# Patient Record
Sex: Female | Born: 1965 | Race: White | Hispanic: No | Marital: Married | Smoking: Never smoker
Health system: Southern US, Community
[De-identification: ages and names within clinical notes are randomized; demographics above are authoritative.]

## PROBLEM LIST (undated history)

## (undated) DIAGNOSIS — T7840XA Allergy, unspecified, initial encounter: Secondary | ICD-10-CM

## (undated) HISTORY — DX: Allergy, unspecified, initial encounter: T78.40XA

---

## 1999-09-12 ENCOUNTER — Inpatient Hospital Stay (HOSPITAL_COMMUNITY): Admission: AD | Admit: 1999-09-12 | Discharge: 1999-09-15 | Payer: Self-pay | Admitting: Obstetrics & Gynecology

## 1999-09-15 ENCOUNTER — Encounter (HOSPITAL_COMMUNITY): Admission: RE | Admit: 1999-09-15 | Discharge: 1999-12-14 | Payer: Self-pay | Admitting: Obstetrics & Gynecology

## 1999-10-13 ENCOUNTER — Other Ambulatory Visit: Admission: RE | Admit: 1999-10-13 | Discharge: 1999-10-13 | Payer: Self-pay | Admitting: Obstetrics & Gynecology

## 2002-04-15 ENCOUNTER — Other Ambulatory Visit: Admission: RE | Admit: 2002-04-15 | Discharge: 2002-04-15 | Payer: Self-pay | Admitting: Obstetrics & Gynecology

## 2003-07-07 ENCOUNTER — Other Ambulatory Visit: Admission: RE | Admit: 2003-07-07 | Discharge: 2003-07-07 | Payer: Self-pay | Admitting: Obstetrics & Gynecology

## 2005-03-30 ENCOUNTER — Other Ambulatory Visit: Admission: RE | Admit: 2005-03-30 | Discharge: 2005-03-30 | Payer: Self-pay | Admitting: Obstetrics & Gynecology

## 2013-05-09 ENCOUNTER — Ambulatory Visit (INDEPENDENT_AMBULATORY_CARE_PROVIDER_SITE_OTHER): Payer: 59 | Admitting: Family Medicine

## 2013-05-09 ENCOUNTER — Encounter: Payer: Self-pay | Admitting: Family Medicine

## 2013-05-09 ENCOUNTER — Ambulatory Visit
Admission: RE | Admit: 2013-05-09 | Discharge: 2013-05-09 | Disposition: A | Discharge status: Home / Self Care | Payer: 59 | Source: Ambulatory Visit | Attending: Family Medicine | Admitting: Family Medicine

## 2013-05-09 VITALS — BP 120/90 | HR 96 | Temp 98.6°F | Resp 18 | Ht 63.0 in | Wt 164.0 lb

## 2013-05-09 DIAGNOSIS — J45901 Unspecified asthma with (acute) exacerbation: Secondary | ICD-10-CM

## 2013-05-09 MED ORDER — PREDNISONE 20 MG PO TABS: ORAL_TABLET | ORAL | Status: DC

## 2013-05-09 MED ORDER — BECLOMETHASONE DIPROPIONATE 80 MCG/ACT IN AERS: 1.0000 | INHALATION_SPRAY | Freq: Two times a day (BID) | RESPIRATORY_TRACT | Status: AC

## 2013-05-09 MED ORDER — ALBUTEROL SULFATE HFA 108 (90 BASE) MCG/ACT IN AERS: 2.0000 | INHALATION_SPRAY | Freq: Four times a day (QID) | RESPIRATORY_TRACT | Status: AC | PRN

## 2013-05-09 NOTE — Progress Notes (Signed)
Subjective:    Patient ID: Traci Robinson, female    DOB: [birth year], 47 y.o.   MRN: [id number]  HPI Pressure was recently seen in urgent care and diagnosed with bronchitis. She was given an antibiotic and a steroid Dosepak. She states that that helped her symptoms immediately. Her symptoms include over the last month a daily cough, wheezing, shortness of breath, tightness in the chest, burning in the chest. She states it is like she is breathing through the straw. She denies any fevers or chills. She denies any hemoptysis. She has no history of asthma. She has no history of allergies. There's no family history of sarcoidosis or alpha-1 antitrypsin deficiency. She has no history of working in a mill, [employer], around asbestos or sandblasting.  She has pets at home but these are past they have had for years. She denies previous allergies to pet dander  is teaching and a new school, however the school was clean and there is no apparent mold damage. History reviewed. No pertinent past medical history. No current outpatient prescriptions on file prior to visit.   No current facility-administered medications on file prior to visit.   No Known Allergies History   Social History  . Marital Status: Married    Spouse Name: N/A    Number of Children: N/A  . Years of Education: N/A   Occupational History  . Not on file.   Social History Main Topics  . Smoking status: Never Smoker   . Smokeless tobacco: Not on file  . Alcohol Use: Yes     Comment: Rare  . Drug Use: No  . Sexually Active: Not on file     Comment: married, teacher   Other Topics Concern  . Not on file   Social History Narrative  . No narrative on file   Family History  Problem Relation Age of Onset  . Cancer Father     non hodgkins lymphoma  . Cancer Paternal Aunt 59    breast cancer      Review of Systems  All other systems reviewed and are negative.       Objective:   Physical Exam  Constitutional: She  appears well-developed and well-nourished.  HENT:  Head: Normocephalic.  Right Ear: External ear normal.  Left Ear: External ear normal.  Nose: Nose normal.  Mouth/Throat: Oropharynx is clear and moist.  Eyes: Conjunctivae are normal. Pupils are equal, round, and reactive to light. Right eye exhibits no discharge. Left eye exhibits no discharge.  Neck: Neck supple. No JVD present. No thyromegaly present.  Cardiovascular: Normal rate, regular rhythm, normal heart sounds and intact distal pulses.  Exam reveals no friction rub.   No murmur heard. Pulmonary/Chest: Effort normal. No respiratory distress. She has wheezes. She has no rales.  Abdominal: Soft. Bowel sounds are normal. She exhibits no distension. There is no tenderness. There is no rebound.  Lymphadenopathy:    She has no cervical adenopathy.   she does become more easily winded. She frequently needs to take a deep inspiration after a sentence.        Assessment & Plan:  1. Asthma with acute exacerbation She is currently having an asthma exacerbation. We will treat this with prednisone 60 mg a day for 5 days. I gave her albuterol 2 puffs inhaled every 6 hours when necessary wheezing. I also started her on a preventative. She is taking 1 puff inhaled twice a day. I like to see the patient back in one month. If  she has had no further flareups, I would consider discontinuing the Qvar.  We'll also obtain a chest x-ray. Consider evaluation for sarcoidosis if symptoms persist. - predniSONE (DELTASONE) 20 MG tablet; 3 tabs poqday for 5 days.  Dispense: 15 tablet; Refill: 0 - albuterol (PROVENTIL HFA;VENTOLIN HFA) 108 (90 BASE) MCG/ACT inhaler; Inhale 2 puffs into the lungs every 6 (six) hours as needed for wheezing.  Dispense: 1 Inhaler; Refill: 0 - beclomethasone (QVAR) 80 MCG/ACT inhaler; Inhale 1 puff into the lungs 2 (two) times daily.  Dispense: 1 Inhaler; Refill: 12 - DG Chest 2 View; Future

## 2015-01-21 ENCOUNTER — Other Ambulatory Visit: Payer: Self-pay | Admitting: Obstetrics & Gynecology

## 2015-01-21 DIAGNOSIS — R928 Other abnormal and inconclusive findings on diagnostic imaging of breast: Secondary | ICD-10-CM

## 2015-02-02 ENCOUNTER — Ambulatory Visit
Admission: RE | Admit: 2015-02-02 | Discharge: 2015-02-02 | Disposition: A | Discharge status: Home / Self Care | Payer: 59 | Source: Ambulatory Visit | Attending: Obstetrics & Gynecology | Admitting: Obstetrics & Gynecology

## 2015-02-02 ENCOUNTER — Encounter (INDEPENDENT_AMBULATORY_CARE_PROVIDER_SITE_OTHER): Payer: Self-pay

## 2015-02-02 DIAGNOSIS — R928 Other abnormal and inconclusive findings on diagnostic imaging of breast: Secondary | ICD-10-CM

## 2015-04-14 ENCOUNTER — Ambulatory Visit (INDEPENDENT_AMBULATORY_CARE_PROVIDER_SITE_OTHER): Payer: 59 | Admitting: Family Medicine

## 2015-04-14 ENCOUNTER — Encounter: Payer: Self-pay | Admitting: Family Medicine

## 2015-04-14 ENCOUNTER — Telehealth: Payer: Self-pay | Admitting: Family Medicine

## 2015-04-14 ENCOUNTER — Ambulatory Visit: Payer: Self-pay | Admitting: Physician Assistant

## 2015-04-14 VITALS — BP 128/66 | HR 70 | Temp 98.9°F | Resp 14 | Ht 63.0 in | Wt 171.0 lb

## 2015-04-14 DIAGNOSIS — M7989 Other specified soft tissue disorders: Secondary | ICD-10-CM | POA: Diagnosis not present

## 2015-04-14 MED ORDER — FUROSEMIDE 20 MG PO TABS: 20.0000 mg | ORAL_TABLET | Freq: Every day | ORAL | Status: AC | PRN

## 2015-04-14 NOTE — Patient Instructions (Signed)
We will call for ultrasound for today  F/U as needed

## 2015-04-14 NOTE — Telephone Encounter (Signed)
Call placed to patient and patient made aware.   Prescription sent to pharmacy.  

## 2015-04-14 NOTE — Addendum Note (Signed)
Addended by: Phillips OdorSIX, Sameka Bagent H on: 04/14/2015 04:36 PM   Modules accepted: Orders

## 2015-04-14 NOTE — Progress Notes (Signed)
Patient ID: Traci Robinson, female   DOB: [birth year], 49 y.o.   MRN: [id number]   Subjective:    Patient ID: Traci PepperJaymie Nguyenthi, female    DOB: [birth year], 49 y.o.   MRN: [id number]  Patient presents for L Ankle Edema  Patient here with left lower leg swelling she denies any injury no significant pain no rash. She is not trauma anywhere she is not on any hormone therapy. She is fairly healthy her only medications are her asthma and allergy medications. This swelling starting yesterday afternoon and has been persistent. She is not change her exercise routine. 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   Review Of Systems:  GEN- denies fatigue, fever, weight loss,weakness, recent illness HEENT- denies eye drainage, change in vision, nasal discharge, CVS- denies chest pain, palpitations RESP- denies SOB, cough, wheeze ABD- denies N/V, change in stools, abd pain GU- denies dysuria, hematuria, dribbling, incontinence MSK- denies joint pain, muscle aches, injury Neuro- denies headache, dizziness, syncope, seizure activity       Objective:    BP 128/66 mmHg  Pulse 70  Temp(Src) 98.9 F (37.2 C) (Oral)  Resp 14  Ht 5\' 3"  (1.6 m)  Wt 171 lb (77.565 kg)  BMI 30.30 kg/m2  LMP 03/14/2015 (Approximate) GEN- NAD, alert and oriented x3 Neck- Supple, no JVD CVS- RRR, no murmur RESP-CTAB EXT- No edema RLE,  Left LE mild edema to mid shin, Left calf measure 2cm greater than right,neg Homans Pulses- Radial, DP- 2+        Assessment & Plan:      Problem List Items Addressed This Visit    None    Visit Diagnoses    Left leg swelling    -  Primary    Will r/o DVT, no signs of phleblitis, no injury to foot/ankle, further instructions pending results    Relevant Orders    US Venous Img Lower Unilateral Left       Note: This dictation was prepared with Dragon dictation along with smaller phrase technology. Any transcriptional errors that result from this process are unintentional.

## 2015-04-14 NOTE — Telephone Encounter (Signed)
Call placed to patient. LMTRC.  

## 2015-04-14 NOTE — Telephone Encounter (Signed)
Call pt no Blood clot or signs of infection seen on Ultrasound  Elevate the foot,  Send in lasix 20mg  daily prn leg swelling #20 Use lasix if swelling does not improve with elevation of foot Call if this gets worse despite medication

## 2015-04-16 ENCOUNTER — Other Ambulatory Visit: Payer: 59

## 2015-05-05 ENCOUNTER — Other Ambulatory Visit: Payer: 59

## 2015-05-05 DIAGNOSIS — Z0000 Encounter for general adult medical examination without abnormal findings: Secondary | ICD-10-CM

## 2015-05-05 LAB — LIPID PANEL
Cholesterol: 121 mg/dL (ref 0–200)
HDL: 39 mg/dL — ABNORMAL LOW (ref >=46)
LDL CALC: 69 mg/dL (ref 0–99)
Total CHOL/HDL Ratio: 3.1 Ratio
Triglycerides: 67 mg/dL (ref <150)
VLDL: 13 mg/dL (ref 0–40)

## 2015-05-05 LAB — CBC WITH DIFFERENTIAL/PLATELET
BASOS ABS: 0 10*3/uL (ref 0.0–0.1)
Basophils Relative: 1 % (ref 0–1)
EOS PCT: 6 % — AB (ref 0–5)
Eosinophils Absolute: 0.2 10*3/uL (ref 0.0–0.7)
HCT: 39.2 % (ref 36.0–46.0)
Hemoglobin: 12.9 g/dL (ref 12.0–15.0)
LYMPHS ABS: 1.1 10*3/uL (ref 0.7–4.0)
Lymphocytes Relative: 34 % (ref 12–46)
MCH: 31.2 pg (ref 26.0–34.0)
MCHC: 32.9 g/dL (ref 30.0–36.0)
MCV: 94.9 fL (ref 78.0–100.0)
MPV: 10.2 fL (ref 8.6–12.4)
Monocytes Absolute: 0.7 10*3/uL (ref 0.1–1.0)
Monocytes Relative: 22 % — ABNORMAL HIGH (ref 3–12)
NEUTROS PCT: 37 % — AB (ref 43–77)
Neutro Abs: 1.2 10*3/uL — ABNORMAL LOW (ref 1.7–7.7)
PLATELETS: 268 10*3/uL (ref 150–400)
RBC: 4.13 MIL/uL (ref 3.87–5.11)
RDW: 12.9 % (ref 11.5–15.5)
WBC: 3.3 10*3/uL — ABNORMAL LOW (ref 4.0–10.5)

## 2015-05-05 LAB — COMPLETE METABOLIC PANEL WITH GFR
ALBUMIN: 3.7 g/dL (ref 3.5–5.2)
ALT: 41 U/L — ABNORMAL HIGH (ref 0–35)
AST: 31 U/L (ref 0–37)
Alkaline Phosphatase: 88 U/L (ref 39–117)
BUN: 8 mg/dL (ref 6–23)
CO2: 23 mEq/L (ref 19–32)
Calcium: 8.9 mg/dL (ref 8.4–10.5)
Chloride: 107 mEq/L (ref 96–112)
Creat: 0.68 mg/dL (ref 0.50–1.10)
GFR, Est African American: >89 mL/min
Glucose, Bld: 88 mg/dL (ref 70–99)
Potassium: 4 mEq/L (ref 3.5–5.3)
Sodium: 139 mEq/L (ref 135–145)
Total Bilirubin: 0.3 mg/dL (ref 0.2–1.2)
Total Protein: 6.3 g/dL (ref 6.0–8.3)

## 2015-05-05 LAB — TSH: TSH: 2.074 u[IU]/mL (ref 0.350–4.500)

## 2015-05-07 ENCOUNTER — Encounter: Payer: Self-pay | Admitting: Family Medicine

## 2015-05-07 ENCOUNTER — Ambulatory Visit (INDEPENDENT_AMBULATORY_CARE_PROVIDER_SITE_OTHER): Payer: 59 | Admitting: Family Medicine

## 2015-05-07 VITALS — BP 128/62 | HR 68 | Temp 98.3°F | Resp 14 | Ht 63.0 in | Wt 170.0 lb

## 2015-05-07 DIAGNOSIS — R7989 Other specified abnormal findings of blood chemistry: Secondary | ICD-10-CM

## 2015-05-07 DIAGNOSIS — Z0000 Encounter for general adult medical examination without abnormal findings: Secondary | ICD-10-CM

## 2015-05-07 DIAGNOSIS — Z23 Encounter for immunization: Secondary | ICD-10-CM | POA: Diagnosis not present

## 2015-05-07 DIAGNOSIS — R945 Abnormal results of liver function studies: Secondary | ICD-10-CM

## 2015-05-07 NOTE — Progress Notes (Signed)
Patient ID: Traci Robinson, female   DOB: October 31, 1966, 49 y.o.   MRN: [id number]   Subjective:    Patient ID: Traci Robinson, female    DOB: October 31, 1966, 49 y.o.   MRN: [id number]  Patient presents for CPE  patient here for complete physical exam. She is no particular concerns. Her previous left leg swelling resolved. Fasting labs reviewed at the bedside. She is being followed by GYN Dr. Jennette KettleNeal for her mammogram as well as Pap smear. She is due for a tetanus booster today. Labs showed non specific WBC changes and isolated ALT elevated, had illness a few days before labs drawn, now resolved   Review Of Systems:  GEN- denies fatigue, fever, weight loss,weakness, recent illness HEENT- denies eye drainage, change in vision, nasal discharge, CVS- denies chest pain, palpitations RESP- denies SOB, cough, wheeze ABD- denies N/V, change in stools, abd pain GU- denies dysuria, hematuria, dribbling, incontinence MSK- denies joint pain, muscle aches, injury Neuro- denies headache, dizziness, syncope, seizure activity       Objective:    BP 128/62 mmHg  Pulse 68  Temp(Src) 98.3 F (36.8 C) (Oral)  Resp 14  Ht 5\' 3"  (1.6 m)  Wt 170 lb (77.111 kg)  BMI 30.12 kg/m2  LMP 04/20/2015 (Approximate) GEN- NAD, alert and oriented x3 HEENT- PERRL, EOMI, non injected sclera, pink conjunctiva, MMM, oropharynx clear Neck- Supple, no thyromegaly CVS- RRR, no murmur RESP-CTAB ABD-NABS,soft,NT,ND EXT- No edema Pulses- Radial, DP- 2+        Assessment & Plan:      Problem List Items Addressed This Visit    None    Visit Diagnoses    Routine general medical examination at a health care facility    -  Primary    CPE done, TDAP given, GYN to do Mammo and PAP Smear, reviewed labs, recheck LFT in 4 weeks very non specific and had recent illness, expect resolution    Elevated LFTs        rEPEAT LABS       Note: This dictation was prepared with Dragon dictation along with smaller phrase technology. Any  transcriptional errors that result from this process are unintentional.

## 2015-05-07 NOTE — Patient Instructions (Signed)
F/u 4 Weeks for repeat liver test TDAP booster given F/U as needed

## 2015-05-08 ENCOUNTER — Encounter: Payer: Self-pay | Admitting: Family Medicine

## 2015-05-10 ENCOUNTER — Encounter: Payer: Self-pay | Admitting: Family Medicine

## 2016-03-21 ENCOUNTER — Other Ambulatory Visit: Payer: Self-pay | Admitting: Obstetrics & Gynecology

## 2016-03-21 DIAGNOSIS — R928 Other abnormal and inconclusive findings on diagnostic imaging of breast: Secondary | ICD-10-CM

## 2016-03-29 ENCOUNTER — Ambulatory Visit
Admission: RE | Admit: 2016-03-29 | Discharge: 2016-03-29 | Disposition: A | Discharge status: Home / Self Care | Payer: BC Managed Care – PPO | Source: Ambulatory Visit | Attending: Obstetrics & Gynecology | Admitting: Obstetrics & Gynecology

## 2016-03-29 DIAGNOSIS — R928 Other abnormal and inconclusive findings on diagnostic imaging of breast: Secondary | ICD-10-CM

## 2016-05-19 IMAGING — MG MM DIAGNOSTIC UNILATERAL R
2 series · 2 of 2 positions shown · non-contrast
Comparison: Priors

CLINICAL DATA: Screening callback for questioned right upper outer
quadrant mass

EXAM:
DIGITAL DIAGNOSTIC right MAMMOGRAM

[R CC]
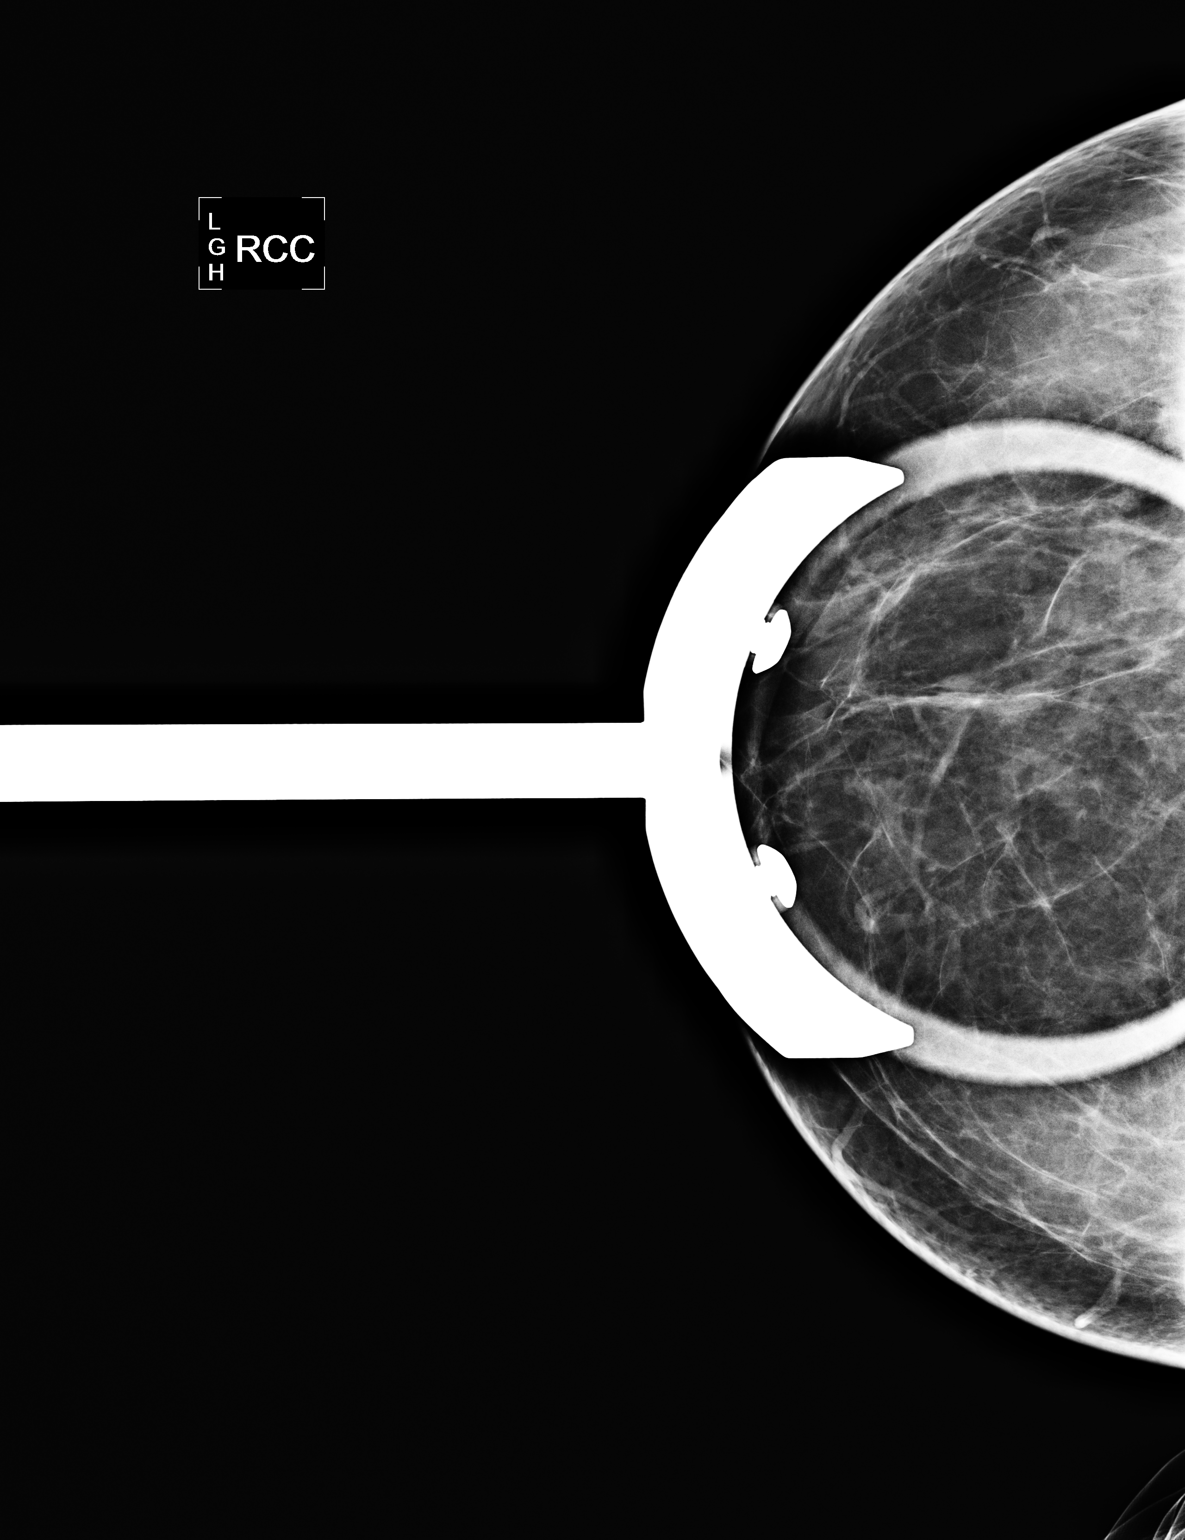

[R MLO]
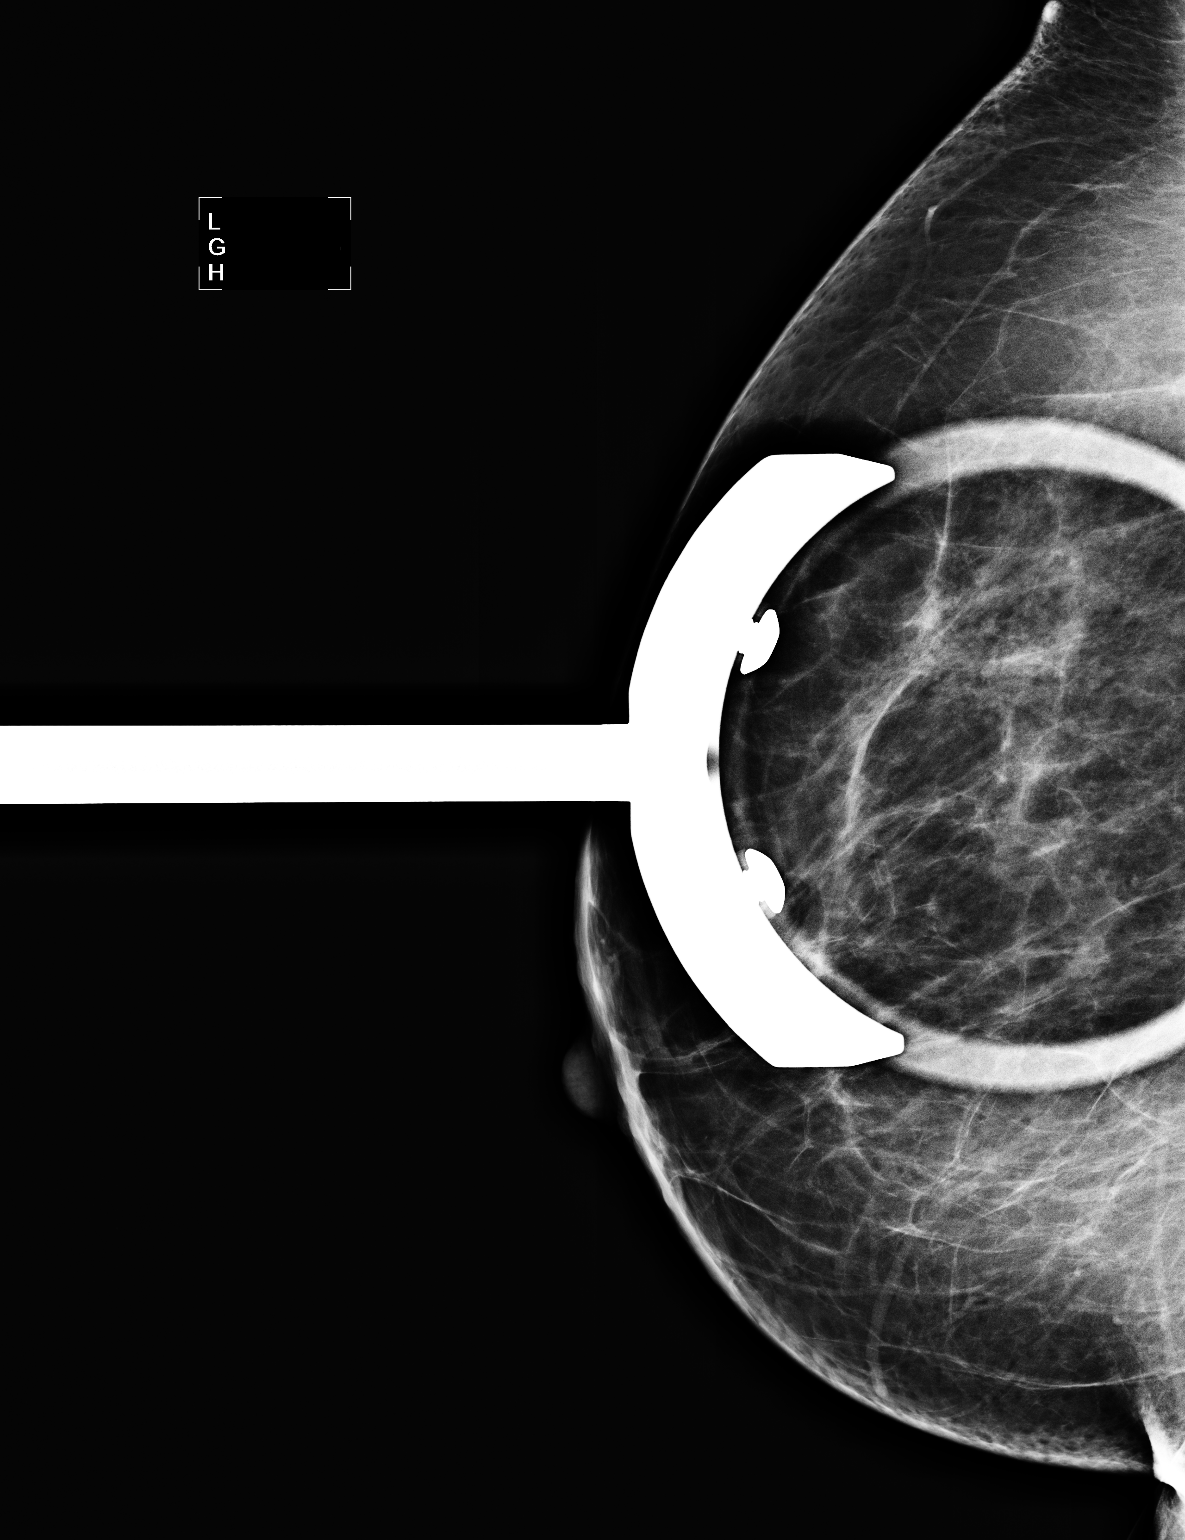

[2 of 2 positions shown; findings below may reference images not displayed]

ACR Breast Density Category c: The breast tissue is heterogeneously
dense, which may obscure small masses.
FINDINGS: No persistent abnormality is identified in the right upper outer
quadrant.
IMPRESSION: No evidence for malignancy in the right breast.

RECOMMENDATION:
Screening mammogram in one year.(Code:[id number]-8-0S1)

I have discussed the findings and recommendations with the patient.
Results were also provided in writing at the conclusion of the
visit. If applicable, a reminder letter will be sent to the patient
regarding the next appointment.

BI-RADS CATEGORY  1: Negative.

## 2016-11-30 ENCOUNTER — Encounter: Payer: Self-pay | Admitting: *Deleted

## 2016-11-30 ENCOUNTER — Ambulatory Visit (INDEPENDENT_AMBULATORY_CARE_PROVIDER_SITE_OTHER): Payer: BC Managed Care – PPO | Admitting: *Deleted

## 2016-11-30 DIAGNOSIS — Z23 Encounter for immunization: Secondary | ICD-10-CM | POA: Diagnosis not present

## 2016-11-30 NOTE — Progress Notes (Signed)
Patient ID: Traci Robinson, female   DOB: 15-Jul-1966, 50 y.o.   MRN: [id number]   Patient seen in office for Influenza Vaccination.   Tolerated IM administration well.   Immunization history updated.

## 2019-08-07 ENCOUNTER — Ambulatory Visit (INDEPENDENT_AMBULATORY_CARE_PROVIDER_SITE_OTHER): Payer: BC Managed Care – PPO | Admitting: Family Medicine

## 2019-08-07 ENCOUNTER — Other Ambulatory Visit: Payer: Self-pay

## 2019-08-07 DIAGNOSIS — J069 Acute upper respiratory infection, unspecified: Secondary | ICD-10-CM

## 2019-08-07 DIAGNOSIS — Z20822 Contact with and (suspected) exposure to covid-19: Secondary | ICD-10-CM

## 2019-08-07 MED ORDER — BENZONATATE 200 MG PO CAPS: 200.0000 mg | ORAL_CAPSULE | Freq: Two times a day (BID) | ORAL | 0 refills | Status: AC | PRN

## 2019-08-07 NOTE — Progress Notes (Signed)
Subjective:    Patient ID: Traci Robinson, female    DOB: 1966-03-16, 53 y.o.   MRN: [id number]  HPI Patient is a very pleasant 53 year old Caucasian female who is being seen today as a telephone visit.  She consents to be seen via telephone.  Phone call began at 1108.  Phone call concluded at 1117.  I recently spoke to her husband earlier in the week.  He was having fever, chills, and cough.  Therefore I had the patient tested for COVID-19.  He tested negative.  She has been sharing similar symptoms.  Her symptoms have been present for less than a week.  Her symptoms include a low-grade fever.  She also has head congestion, sore throat, and cough.  She denies any shortness of breath.  She denies any chest pain.  The patient states that she feels okay.  This feels like a typical upper respiratory infection to her.  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 Past Medical History:  Diagnosis Date  . Allergy    No past surgical history on file. Current Outpatient Medications on File Prior to Visit  Medication Sig Dispense Refill  . albuterol (PROVENTIL HFA;VENTOLIN HFA) 108 (90 BASE) MCG/ACT inhaler Inhale 2 puffs into the lungs every 6 (six) hours as needed for wheezing. 1 Inhaler 0  . beclomethasone (QVAR) 80 MCG/ACT inhaler Inhale 1 puff into the lungs 2 (two) times daily. 1 Inhaler 12  . furosemide (LASIX) 20 MG tablet Take 1 tablet (20 mg total) by mouth daily as needed for edema. 20 tablet 0  . montelukast (SINGULAIR) 10 MG tablet Take 10 mg by mouth at bedtime.     No current facility-administered medications on file prior to visit.    No Known Allergies Social History   Socioeconomic History  . Marital status: Married    Spouse name: Not on file  . Number of children: Not on file  . Years of education: Not on file  . Highest  education level: Not on file  Occupational History  . Not on file  Social Needs  . Financial resource strain: Not on file  . Food insecurity    Worry: Not on file    Inability: Not on file  . Transportation needs    Medical: Not on file    Non-medical: Not on file  Tobacco Use  . Smoking status: Never Smoker  . Smokeless tobacco: Never Used  Substance and Sexual Activity  . Alcohol use: Yes    Alcohol/week: 0.0 standard drinks    Comment: Rare  . Drug use: No  . Sexual activity: Yes    Comment: married, teacher  Lifestyle  . Physical activity    Days per week: Not on file    Minutes per session: Not on file  . Stress: Not on file  Relationships  . Social Herbalist on phone: Not on file    Gets together: Not on file    Attends religious service: Not on file    Active member of club or organization: Not on file    Attends meetings of clubs or organizations: Not on file    Relationship status: Not on file  . Intimate partner violence    Fear of current or ex partner: Not on file  Emotionally abused: Not on file    Physically abused: Not on file    Forced sexual activity: Not on file  Other Topics Concern  . Not on file  Social History Narrative  . Not on file      Review of Systems  All other systems reviewed and are negative.      Objective:   Physical Exam  Physical exam could not be performed today as the patient was seen as a telephone visit however she is speaking full and complete sentences with no respiratory distress.  She reports a low-grade subjective fever but no objective fever.      Assessment & Plan:  The encounter diagnosis was URI, acute. Given the fact her husband tested negative, I believe the patient most likely has a viral upper respiratory infection similar to a cold.  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  She will go today for testing.  Meanwhile we will treat the patient symptomatically with Tessalon Perles 200 mg every 8 8 hours as needed for cough.

## 2019-08-09 LAB — NOVEL CORONAVIRUS, NAA: SARS-CoV-2, NAA: NOT DETECTED

## 2019-08-09 LAB — SPECIMEN STATUS REPORT

## 2021-07-15 LAB — EXTERNAL GENERIC LAB PROCEDURE: COLOGUARD: NEGATIVE

## 2022-09-14 ENCOUNTER — Other Ambulatory Visit: Payer: Self-pay | Admitting: Obstetrics and Gynecology

## 2022-09-14 DIAGNOSIS — R928 Other abnormal and inconclusive findings on diagnostic imaging of breast: Secondary | ICD-10-CM

## 2022-09-28 ENCOUNTER — Other Ambulatory Visit: Payer: BC Managed Care – PPO

## 2022-09-29 ENCOUNTER — Other Ambulatory Visit: Payer: Self-pay | Admitting: Obstetrics and Gynecology

## 2022-09-29 ENCOUNTER — Ambulatory Visit
Admission: RE | Admit: 2022-09-29 | Discharge: 2022-09-29 | Disposition: A | Discharge status: Home / Self Care | Payer: BC Managed Care – PPO | Source: Ambulatory Visit | Attending: Obstetrics and Gynecology | Admitting: Obstetrics and Gynecology

## 2022-09-29 DIAGNOSIS — R928 Other abnormal and inconclusive findings on diagnostic imaging of breast: Secondary | ICD-10-CM

## 2022-09-29 DIAGNOSIS — N6310 Unspecified lump in the right breast, unspecified quadrant: Secondary | ICD-10-CM

## 2022-10-06 ENCOUNTER — Ambulatory Visit
Admission: RE | Admit: 2022-10-06 | Discharge: 2022-10-06 | Disposition: A | Discharge status: Home / Self Care | Payer: BC Managed Care – PPO | Source: Ambulatory Visit | Attending: Obstetrics and Gynecology | Admitting: Obstetrics and Gynecology

## 2022-10-06 ENCOUNTER — Ambulatory Visit
Admission: RE | Admit: 2022-10-06 | Discharge: 2022-10-06 | Disposition: A | Discharge status: Home / Self Care | Payer: No Typology Code available for payment source | Source: Ambulatory Visit | Attending: Obstetrics and Gynecology | Admitting: Obstetrics and Gynecology

## 2022-10-06 DIAGNOSIS — N6310 Unspecified lump in the right breast, unspecified quadrant: Secondary | ICD-10-CM

## 2023-02-20 ENCOUNTER — Other Ambulatory Visit (HOSPITAL_BASED_OUTPATIENT_CLINIC_OR_DEPARTMENT_OTHER): Payer: Self-pay

## 2023-02-20 MED ORDER — LISDEXAMFETAMINE DIMESYLATE 30 MG PO CAPS
30.0000 mg | ORAL_CAPSULE | Freq: Every day | ORAL | 0 refills | Status: DC
  Filled 2023-02-20: qty 30, 30d supply, fill #0

## 2023-02-21 ENCOUNTER — Other Ambulatory Visit (HOSPITAL_BASED_OUTPATIENT_CLINIC_OR_DEPARTMENT_OTHER): Payer: Self-pay

## 2023-03-28 ENCOUNTER — Other Ambulatory Visit (HOSPITAL_BASED_OUTPATIENT_CLINIC_OR_DEPARTMENT_OTHER): Payer: Self-pay

## 2023-03-28 MED ORDER — LISDEXAMFETAMINE DIMESYLATE 30 MG PO CAPS
30.0000 mg | ORAL_CAPSULE | Freq: Every day | ORAL | 0 refills | Status: DC
  Filled 2023-03-28: qty 30, 30d supply, fill #0

## 2023-05-07 ENCOUNTER — Other Ambulatory Visit (HOSPITAL_BASED_OUTPATIENT_CLINIC_OR_DEPARTMENT_OTHER): Payer: Self-pay

## 2023-05-07 MED ORDER — LISDEXAMFETAMINE DIMESYLATE 30 MG PO CAPS
30.0000 mg | ORAL_CAPSULE | Freq: Every day | ORAL | 0 refills | Status: DC
  Filled 2023-05-07: qty 30, 30d supply, fill #0

## 2023-06-12 ENCOUNTER — Other Ambulatory Visit (HOSPITAL_BASED_OUTPATIENT_CLINIC_OR_DEPARTMENT_OTHER): Payer: Self-pay

## 2023-06-12 MED ORDER — LISDEXAMFETAMINE DIMESYLATE 30 MG PO CAPS
30.0000 mg | ORAL_CAPSULE | Freq: Every day | ORAL | 0 refills | Status: DC
  Filled 2023-06-12: qty 30, 30d supply, fill #0

## 2023-07-20 ENCOUNTER — Other Ambulatory Visit (HOSPITAL_BASED_OUTPATIENT_CLINIC_OR_DEPARTMENT_OTHER): Payer: Self-pay

## 2023-07-20 MED ORDER — LISDEXAMFETAMINE DIMESYLATE 30 MG PO CAPS
30.0000 mg | ORAL_CAPSULE | Freq: Every day | ORAL | 0 refills | Status: AC
  Filled 2023-07-20: qty 30, 30d supply, fill #0

## 2023-07-20 MED ORDER — LISDEXAMFETAMINE DIMESYLATE 30 MG PO CAPS
30.0000 mg | ORAL_CAPSULE | Freq: Every day | ORAL | 0 refills | Status: DC
  Filled 2023-10-29: qty 30, 30d supply, fill #0

## 2023-07-20 MED ORDER — LISDEXAMFETAMINE DIMESYLATE 30 MG PO CAPS
30.0000 mg | ORAL_CAPSULE | Freq: Every day | ORAL | 0 refills | Status: AC
  Filled 2023-09-20: qty 30, 30d supply, fill #0

## 2023-09-20 ENCOUNTER — Other Ambulatory Visit (HOSPITAL_BASED_OUTPATIENT_CLINIC_OR_DEPARTMENT_OTHER): Payer: Self-pay

## 2023-10-29 ENCOUNTER — Other Ambulatory Visit (HOSPITAL_BASED_OUTPATIENT_CLINIC_OR_DEPARTMENT_OTHER): Payer: Self-pay

## 2023-10-29 ENCOUNTER — Other Ambulatory Visit: Payer: Self-pay

## 2023-12-06 ENCOUNTER — Ambulatory Visit: Payer: BC Managed Care – PPO | Admitting: Podiatry

## 2023-12-06 ENCOUNTER — Ambulatory Visit (INDEPENDENT_AMBULATORY_CARE_PROVIDER_SITE_OTHER): Payer: No Typology Code available for payment source

## 2023-12-06 DIAGNOSIS — M7672 Peroneal tendinitis, left leg: Secondary | ICD-10-CM | POA: Diagnosis not present

## 2023-12-06 DIAGNOSIS — M778 Other enthesopathies, not elsewhere classified: Secondary | ICD-10-CM

## 2023-12-06 MED ORDER — MELOXICAM 15 MG PO TABS: 15.0000 mg | ORAL_TABLET | Freq: Every day | ORAL | 0 refills | Status: AC | PRN

## 2023-12-06 NOTE — Patient Instructions (Signed)
While at your visit today you received a steroid injection in your foot or ankle to help with your pain. Along with having the steroid medication there is some "numbing" medication in the shot that you received. Due to this you may notice some numbness to the area for the next couple of hours.   I would recommend limiting activity for the next few days to help the steroid injection take affect.    The actually benefit from the steroid injection may take up to 2-7 days to see a difference. You may actually experience a small (as in 10%) INCREASE in pain in the first 24 hours---that is common. It would be best if you can ice the area today and take anti-inflammatory medications (such as Ibuprofen, Motrin, or Aleve) if you are able to take these medications. If you were prescribed another medication to help with the pain go ahead and start that medication today    Things to watch out for that you should contact us or a health care provider urgently would include: 1. Unusual (as in more than 10%) increase in pain 2. New fever > 101.5 3. New swelling or redness of the injected area.  4. Streaking of red lines around the area injected.  If you have any questions or concerns about this, please give our office a call at [phone].    --  Peroneal Tendinopathy Rehab Ask your health care provider which exercises are safe for you. Do exercises exactly as told by your health care provider and adjust them as directed. It is normal to feel mild stretching, pulling, tightness, or discomfort as you do these exercises. Stop right away if you feel sudden pain or your pain gets worse. Do not begin these exercises until told by your health care provider. Stretching and range-of-motion exercises These exercises warm up your muscles and joints. They can help improve the movement and flexibility of your ankle. They may also help to relieve pain and stiffness. Gastrocnemius and soleus stretch, standing This is an  exercise in which you stand on a step and use your body weight to stretch your calf muscles. To do this exercise: Stand on the edge of a step on the ball of your left / right foot. The ball of your foot is on the walking surface, right under your toes. Keep your other foot firmly on the same step. Hold on to the wall, a railing, or a chair for balance. Slowly lift your other foot, allowing your body weight to press your left / right heel down over the edge of the step. You should feel a stretch in your left / right calf (gastrocnemius and soleus). Hold this position for __________ seconds. Return both feet to the step. Repeat this exercise with a slight bend in your left / right knee. Repeat __________ times with your left / right knee straight and __________ times with your left / right knee bent. Complete this exercise __________ times a day. Strengthening exercises These exercises build strength and endurance in your foot and ankle. Endurance is the ability to use your muscles for a long time, even after they get tired. Ankle dorsiflexion with band  Secure a rubber exercise band or tube to an object, such as a table leg, that will not move when the band is pulled. Secure the other end of the band around your left / right foot. Sit on the floor. Face the object with your left / right leg extended. The band or tube  should be slightly tense when your foot is relaxed. Slowly flex your left / right ankle and toes to bring your foot toward you (dorsiflexion). Hold this position for __________ seconds. Let the band or tube slowly pull your foot back to the starting position. Repeat __________ times. Complete this exercise __________ times a day. Ankle eversion  Sit on the floor with your legs straight out in front of you. Loop a rubber exercise band or tube around the ball of your left / right foot. The ball of your foot is on the walking surface, right under your toes. Hold the ends of the band  in your hands. You can also secure the band to a stable object. The band or tube should be slightly tense when your foot is relaxed. Slowly push your foot outward, away from your other leg (eversion). Hold this position for __________ seconds. Slowly return your foot to the starting position. Repeat __________ times. Complete this exercise __________ times a day. Plantar flexion, standing This exercise is sometimes called a standing heel raise. Stand with your feet shoulder-width apart. Place your hands on a wall or table to steady yourself as needed. Try not to use it for support. Keep your weight spread evenly over the width of your feet while you slowly rise up on your toes (plantar flexion). If told by your health care provider: Shift your weight toward your left / right leg until you feel challenged. Stand on your left / right leg only. Hold this position for __________ seconds. Repeat __________ times. Complete this exercise __________ times a day. Single leg stand  Without shoes, stand near a railing or in a doorway. You may hold on to the railing or doorframe as needed. Stand on your left / right foot. Keep your big toe down on the floor and try to keep your arch lifted. Do not roll to the outside of your foot. If this exercise is too easy, you can try it with your eyes closed or while standing on a pillow. Hold this position for __________ seconds. Repeat __________ times. Complete this exercise __________ times a day. This information is not intended to replace advice given to you by your health care provider. Make sure you discuss any questions you have with your health care provider. Document Revised: 04/06/2022 Document Reviewed: 04/06/2022 Elsevier Patient Education  2024 [employer].

## 2023-12-06 NOTE — Progress Notes (Signed)
Subjective:   Patient ID: Traci Robinson, female   DOB: 57 y.o.   MRN: [id number]   HPI No chief complaint on file. 57 year old female presents the office today with concerns of pain to her ankle.  She does not report any recent injuries or any change in activity and the symptoms started.  Small on the lateral aspect of the leg, ankle.  She said that changing shoes do not make a difference.  She tried ice which does feel good temporarily.  No other treatments.  She has no other concerns.  Review of Systems  All other systems reviewed and are negative.  Past Medical History:  Diagnosis Date   Allergy     No past surgical history on file.   Current Outpatient Medications:    meloxicam (MOBIC) 15 MG tablet, Take 1 tablet (15 mg total) by mouth daily as needed for pain., Disp: 30 tablet, Rfl: 0   albuterol (PROVENTIL HFA;VENTOLIN HFA) 108 (90 BASE) MCG/ACT inhaler, Inhale 2 puffs into the lungs every 6 (six) hours as needed for wheezing., Disp: 1 Inhaler, Rfl: 0   beclomethasone (QVAR) 80 MCG/ACT inhaler, Inhale 1 puff into the lungs 2 (two) times daily., Disp: 1 Inhaler, Rfl: 12   benzonatate (TESSALON) 200 MG capsule, Take 1 capsule (200 mg total) by mouth 2 (two) times daily as needed for cough., Disp: 20 capsule, Rfl: 0   furosemide (LASIX) 20 MG tablet, Take 1 tablet (20 mg total) by mouth daily as needed for edema., Disp: 20 tablet, Rfl: 0   lisdexamfetamine (VYVANSE) 30 MG capsule, Take 1 capsule (30 mg total) by mouth daily., Disp: 30 capsule, Rfl: 0   lisdexamfetamine (VYVANSE) 30 MG capsule, Take 1 capsule (30 mg total) by mouth daily., Disp: 30 capsule, Rfl: 0   lisdexamfetamine (VYVANSE) 30 MG capsule, Take 1 capsule (30 mg total) by mouth daily., Disp: 30 capsule, Rfl: 0   montelukast (SINGULAIR) 10 MG tablet, Take 10 mg by mouth at bedtime., Disp: , Rfl:   No Known Allergies         Objective:  Physical Exam  General: AAO x3, NAD  Dermatological: Skin is warm, dry and  supple bilateral. There are no open sores, no preulcerative lesions, no rash or signs of infection present.  Vascular: Dorsalis Pedis artery and Posterior Tibial artery pedal pulses are 2/4 bilateral with immedate capillary fill time. There is no pain with calf compression, swelling, warmth, erythema.   Neruologic: Grossly intact via light touch bilateral.   Musculoskeletal: Moderate tenderness is localized on the lateral aspect of the sinus tarsi along the course the peroneal tendon as well.  No pain to the ankle joint itself.  There is no crepitation with range of motion.  Evaluate for evaluation of slight supination noted.      Assessment:   57 year old female capsulitis, peroneal tendinitis     Plan:  -Treatment options discussed including all alternatives, risks, and complications -Etiology of symptoms were discussed -X-rays were obtained and reviewed with the patient.  Multiple views of the left foot, ankle were obtained.  No subacute fracture noted.  Calcaneal spurring present.  Os peroneum present. -Steroid injection of one of the sinus tarsi.  Skin cleaned Betadine, alcohol.  Mixture of 1 cc Kenalog 10, 0.5 cc of Marcaine plain, 0.5 cc of lidocaine plain was infiltrated into the sinus tarsi without complications.  Postinjection care discussed.  Tolerated well -Prescribed mobic. Discussed side effects of the medication and directed to stop if any are  to occur and call the office.  -Stretching/icing daily. -Supportive shoegear. Consider orthotics however I did add a lateral posterior insert today.   No follow-ups on file.  Vivi Barrack DPM

## 2023-12-07 ENCOUNTER — Other Ambulatory Visit (HOSPITAL_BASED_OUTPATIENT_CLINIC_OR_DEPARTMENT_OTHER): Payer: Self-pay

## 2023-12-10 ENCOUNTER — Other Ambulatory Visit: Payer: Self-pay

## 2023-12-10 ENCOUNTER — Other Ambulatory Visit (HOSPITAL_BASED_OUTPATIENT_CLINIC_OR_DEPARTMENT_OTHER): Payer: Self-pay

## 2023-12-10 MED ORDER — LISDEXAMFETAMINE DIMESYLATE 30 MG PO CAPS
30.0000 mg | ORAL_CAPSULE | Freq: Every day | ORAL | 0 refills | Status: AC
  Filled 2023-12-10: qty 30, 30d supply, fill #0

## 2023-12-10 MED ORDER — LISDEXAMFETAMINE DIMESYLATE 30 MG PO CAPS
30.0000 mg | ORAL_CAPSULE | Freq: Every day | ORAL | 0 refills | Status: AC
  Filled 2024-01-16: qty 30, 30d supply, fill #0

## 2024-01-16 ENCOUNTER — Other Ambulatory Visit (HOSPITAL_BASED_OUTPATIENT_CLINIC_OR_DEPARTMENT_OTHER): Payer: Self-pay

## 2024-01-16 MED ORDER — LISDEXAMFETAMINE DIMESYLATE 30 MG PO CAPS
30.0000 mg | ORAL_CAPSULE | Freq: Every day | ORAL | 0 refills | Status: AC
  Filled 2024-01-16 – 2024-03-25 (×2): qty 30, 30d supply, fill #0

## 2024-01-16 MED ORDER — LISDEXAMFETAMINE DIMESYLATE 30 MG PO CAPS
30.0000 mg | ORAL_CAPSULE | Freq: Every day | ORAL | 0 refills | Status: DC
  Filled 2024-05-01: qty 30, 30d supply, fill #0

## 2024-01-16 MED ORDER — LISDEXAMFETAMINE DIMESYLATE 30 MG PO CAPS
30.0000 mg | ORAL_CAPSULE | Freq: Every day | ORAL | 0 refills | Status: AC
  Filled 2024-02-19: qty 30, 30d supply, fill #0

## 2024-01-17 ENCOUNTER — Ambulatory Visit: Payer: No Typology Code available for payment source | Admitting: Podiatry

## 2024-02-19 ENCOUNTER — Other Ambulatory Visit (HOSPITAL_BASED_OUTPATIENT_CLINIC_OR_DEPARTMENT_OTHER): Payer: Self-pay

## 2024-03-25 ENCOUNTER — Other Ambulatory Visit (HOSPITAL_BASED_OUTPATIENT_CLINIC_OR_DEPARTMENT_OTHER): Payer: Self-pay

## 2024-03-31 ENCOUNTER — Other Ambulatory Visit (HOSPITAL_BASED_OUTPATIENT_CLINIC_OR_DEPARTMENT_OTHER): Payer: Self-pay

## 2024-05-01 ENCOUNTER — Other Ambulatory Visit (HOSPITAL_BASED_OUTPATIENT_CLINIC_OR_DEPARTMENT_OTHER): Payer: Self-pay

## 2024-06-02 ENCOUNTER — Other Ambulatory Visit (HOSPITAL_BASED_OUTPATIENT_CLINIC_OR_DEPARTMENT_OTHER): Payer: Self-pay

## 2024-06-03 ENCOUNTER — Other Ambulatory Visit (HOSPITAL_BASED_OUTPATIENT_CLINIC_OR_DEPARTMENT_OTHER): Payer: Self-pay

## 2024-06-03 ENCOUNTER — Other Ambulatory Visit: Payer: Self-pay

## 2024-06-03 MED ORDER — LISDEXAMFETAMINE DIMESYLATE 30 MG PO CAPS
30.0000 mg | ORAL_CAPSULE | Freq: Every day | ORAL | 0 refills | Status: AC
  Filled 2024-06-03: qty 30, 30d supply, fill #0

## 2024-06-03 MED ORDER — LISDEXAMFETAMINE DIMESYLATE 30 MG PO CAPS
30.0000 mg | ORAL_CAPSULE | Freq: Every day | ORAL | 0 refills | Status: DC
  Filled 2024-07-18: qty 30, 30d supply, fill #0

## 2024-07-18 ENCOUNTER — Other Ambulatory Visit (HOSPITAL_BASED_OUTPATIENT_CLINIC_OR_DEPARTMENT_OTHER): Payer: Self-pay

## 2024-07-25 ENCOUNTER — Other Ambulatory Visit (HOSPITAL_BASED_OUTPATIENT_CLINIC_OR_DEPARTMENT_OTHER): Payer: Self-pay

## 2024-07-25 MED ORDER — LISDEXAMFETAMINE DIMESYLATE 30 MG PO CAPS
30.0000 mg | ORAL_CAPSULE | Freq: Every day | ORAL | 0 refills | Status: AC
  Filled 2024-09-04: qty 30, 30d supply, fill #0

## 2024-07-25 MED ORDER — LISDEXAMFETAMINE DIMESYLATE 30 MG PO CAPS
30.0000 mg | ORAL_CAPSULE | ORAL | 0 refills | Status: AC
  Filled 2024-10-09: qty 30, 30d supply, fill #0

## 2024-07-25 MED ORDER — LISDEXAMFETAMINE DIMESYLATE 30 MG PO CAPS
ORAL_CAPSULE | ORAL | 0 refills | Status: DC
  Filled 2024-11-07: qty 30, 30d supply, fill #0

## 2024-09-04 ENCOUNTER — Other Ambulatory Visit (HOSPITAL_BASED_OUTPATIENT_CLINIC_OR_DEPARTMENT_OTHER): Payer: Self-pay

## 2024-10-09 ENCOUNTER — Other Ambulatory Visit (HOSPITAL_BASED_OUTPATIENT_CLINIC_OR_DEPARTMENT_OTHER): Payer: Self-pay

## 2024-11-07 ENCOUNTER — Other Ambulatory Visit (HOSPITAL_BASED_OUTPATIENT_CLINIC_OR_DEPARTMENT_OTHER): Payer: Self-pay

## 2024-12-10 ENCOUNTER — Other Ambulatory Visit (HOSPITAL_BASED_OUTPATIENT_CLINIC_OR_DEPARTMENT_OTHER): Payer: Self-pay

## 2024-12-12 MED ORDER — LISDEXAMFETAMINE DIMESYLATE 30 MG PO CAPS
30.0000 mg | ORAL_CAPSULE | Freq: Every day | ORAL | 0 refills | Status: DC
  Filled 2024-12-12: qty 30, 30d supply, fill #0

## 2024-12-31 ENCOUNTER — Other Ambulatory Visit (HOSPITAL_BASED_OUTPATIENT_CLINIC_OR_DEPARTMENT_OTHER): Payer: Self-pay

## 2024-12-31 MED ORDER — LISDEXAMFETAMINE DIMESYLATE 30 MG PO CAPS: 30.0000 mg | ORAL_CAPSULE | Freq: Every day | ORAL | 0 refills | Status: AC

## 2024-12-31 MED ORDER — LISDEXAMFETAMINE DIMESYLATE 30 MG PO CAPS
30.0000 mg | ORAL_CAPSULE | Freq: Every day | ORAL | 0 refills | Status: AC
  Filled 2025-01-16: qty 30, 30d supply, fill #0

## 2025-01-16 ENCOUNTER — Other Ambulatory Visit (HOSPITAL_BASED_OUTPATIENT_CLINIC_OR_DEPARTMENT_OTHER): Payer: Self-pay

## 2025-01-16 ENCOUNTER — Other Ambulatory Visit: Payer: Self-pay
